# Patient Record
Sex: Female | Born: 1940 | Hispanic: No | Marital: Married
Health system: Southern US, Community
[De-identification: ages and names within clinical notes are randomized; demographics above are authoritative.]

---

## 2003-07-15 ENCOUNTER — Other Ambulatory Visit: Payer: Self-pay

## 2003-11-30 ENCOUNTER — Other Ambulatory Visit: Payer: Self-pay

## 2003-11-30 ENCOUNTER — Ambulatory Visit: Payer: Self-pay | Admitting: Surgery

## 2003-12-02 ENCOUNTER — Ambulatory Visit: Payer: Self-pay | Admitting: Surgery

## 2004-09-27 ENCOUNTER — Other Ambulatory Visit: Payer: Self-pay

## 2004-09-27 ENCOUNTER — Ambulatory Visit: Payer: Self-pay | Admitting: Surgery

## 2004-09-30 ENCOUNTER — Ambulatory Visit: Payer: Self-pay | Admitting: Surgery

## 2004-10-31 ENCOUNTER — Ambulatory Visit: Payer: Self-pay | Admitting: Surgery

## 2004-11-21 ENCOUNTER — Ambulatory Visit: Payer: Self-pay | Admitting: Surgery

## 2004-11-22 ENCOUNTER — Ambulatory Visit: Payer: Self-pay | Admitting: Surgery

## 2004-12-06 ENCOUNTER — Ambulatory Visit: Payer: Self-pay | Admitting: Surgery

## 2005-02-16 ENCOUNTER — Inpatient Hospital Stay: Payer: Self-pay | Admitting: Internal Medicine

## 2005-03-08 ENCOUNTER — Other Ambulatory Visit: Payer: Self-pay

## 2005-03-15 ENCOUNTER — Inpatient Hospital Stay: Payer: Self-pay | Admitting: Surgery

## 2005-03-21 ENCOUNTER — Inpatient Hospital Stay (HOSPITAL_COMMUNITY)
Admission: RE | Admit: 2005-03-21 | Discharge: 2005-03-28 | Payer: Self-pay | Admitting: Physical Medicine & Rehabilitation

## 2005-03-21 ENCOUNTER — Ambulatory Visit: Payer: Self-pay | Admitting: Physical Medicine & Rehabilitation

## 2005-12-01 ENCOUNTER — Ambulatory Visit: Payer: Self-pay | Admitting: Gastroenterology

## 2006-01-26 ENCOUNTER — Ambulatory Visit: Payer: Self-pay | Admitting: Internal Medicine

## 2006-02-20 ENCOUNTER — Ambulatory Visit: Payer: Self-pay | Admitting: Gastroenterology

## 2006-02-26 ENCOUNTER — Ambulatory Visit: Payer: Self-pay | Admitting: Internal Medicine

## 2008-06-15 ENCOUNTER — Ambulatory Visit: Payer: Self-pay | Admitting: Internal Medicine

## 2008-08-07 ENCOUNTER — Ambulatory Visit: Payer: Self-pay | Admitting: Family Medicine

## 2010-02-24 ENCOUNTER — Ambulatory Visit: Payer: Self-pay | Admitting: Internal Medicine

## 2010-07-11 ENCOUNTER — Ambulatory Visit: Payer: Self-pay | Admitting: Neurology

## 2010-08-31 ENCOUNTER — Encounter: Payer: Self-pay | Admitting: Nurse Practitioner

## 2010-08-31 ENCOUNTER — Encounter: Payer: Self-pay | Admitting: Cardiothoracic Surgery

## 2010-09-03 ENCOUNTER — Encounter: Payer: Self-pay | Admitting: Nurse Practitioner

## 2010-09-03 ENCOUNTER — Encounter: Payer: Self-pay | Admitting: Cardiothoracic Surgery

## 2010-10-03 ENCOUNTER — Encounter: Payer: Self-pay | Admitting: Nurse Practitioner

## 2010-10-20 ENCOUNTER — Encounter: Payer: Self-pay | Admitting: Cardiothoracic Surgery

## 2010-11-03 ENCOUNTER — Inpatient Hospital Stay: Payer: Self-pay | Admitting: Internal Medicine

## 2010-11-03 ENCOUNTER — Encounter: Payer: Self-pay | Admitting: Nurse Practitioner

## 2010-11-10 ENCOUNTER — Encounter: Payer: Self-pay | Admitting: Cardiothoracic Surgery

## 2010-11-24 ENCOUNTER — Emergency Department: Payer: Self-pay | Admitting: Unknown Physician Specialty

## 2010-12-03 ENCOUNTER — Encounter: Payer: Self-pay | Admitting: Nurse Practitioner

## 2010-12-03 ENCOUNTER — Encounter: Payer: Self-pay | Admitting: Cardiothoracic Surgery

## 2011-01-03 ENCOUNTER — Encounter: Payer: Self-pay | Admitting: Nurse Practitioner

## 2011-01-03 ENCOUNTER — Encounter: Payer: Self-pay | Admitting: Cardiothoracic Surgery

## 2013-01-03 IMAGING — CR DG CHEST 2V
1 series · 3 of 3 positions shown · non-contrast
Comparison: none

REASON FOR EXAM: elevated wbc sob
COMMENTS:

[Series 1: x chest ap · 0.14mm/px · 3 of 3 slices shown]
[im 1/3]
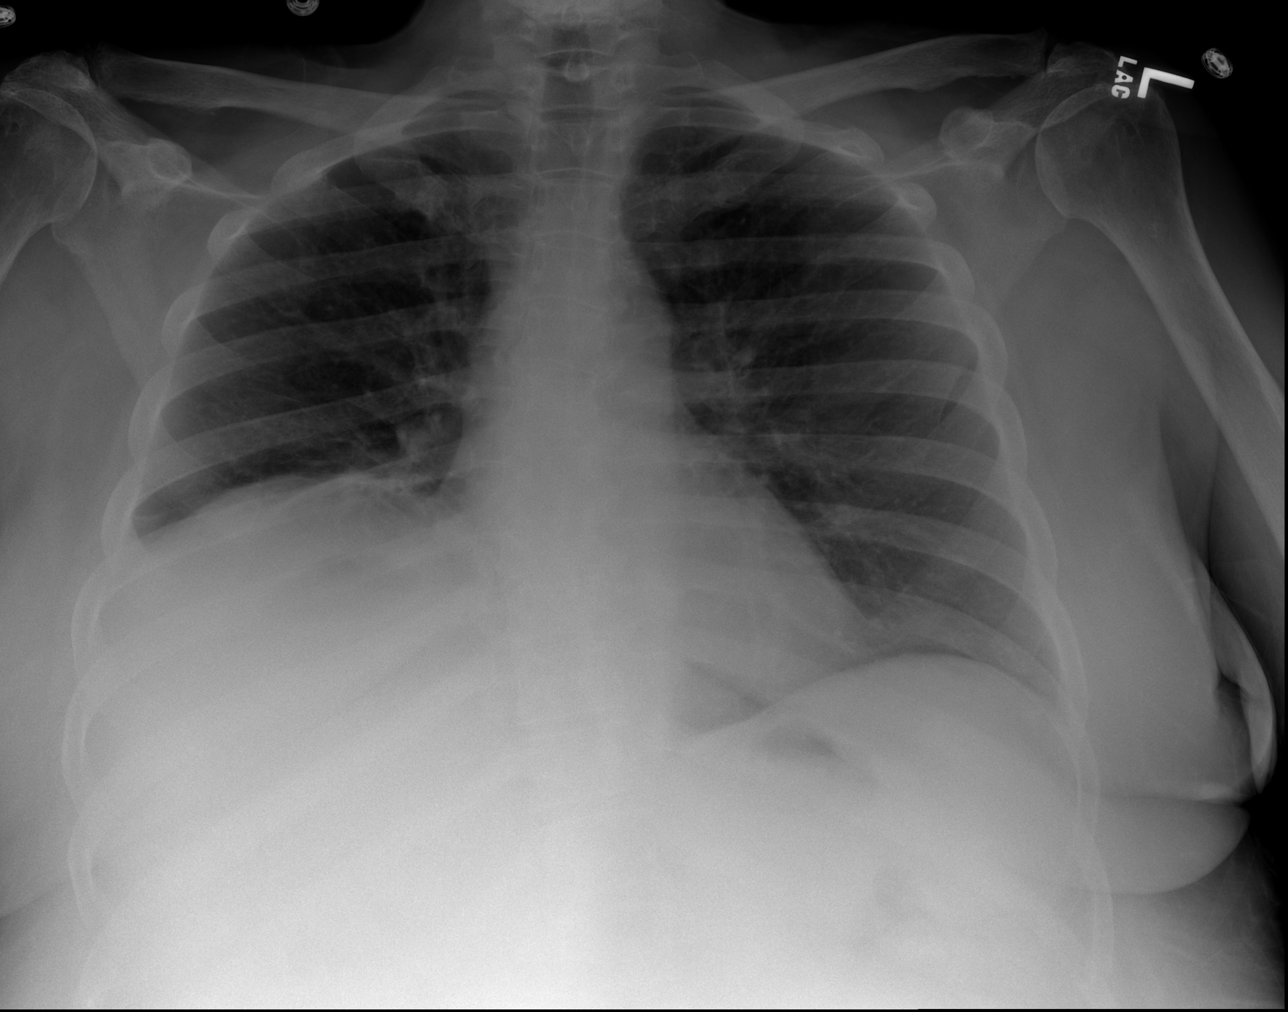
[im 2/3]
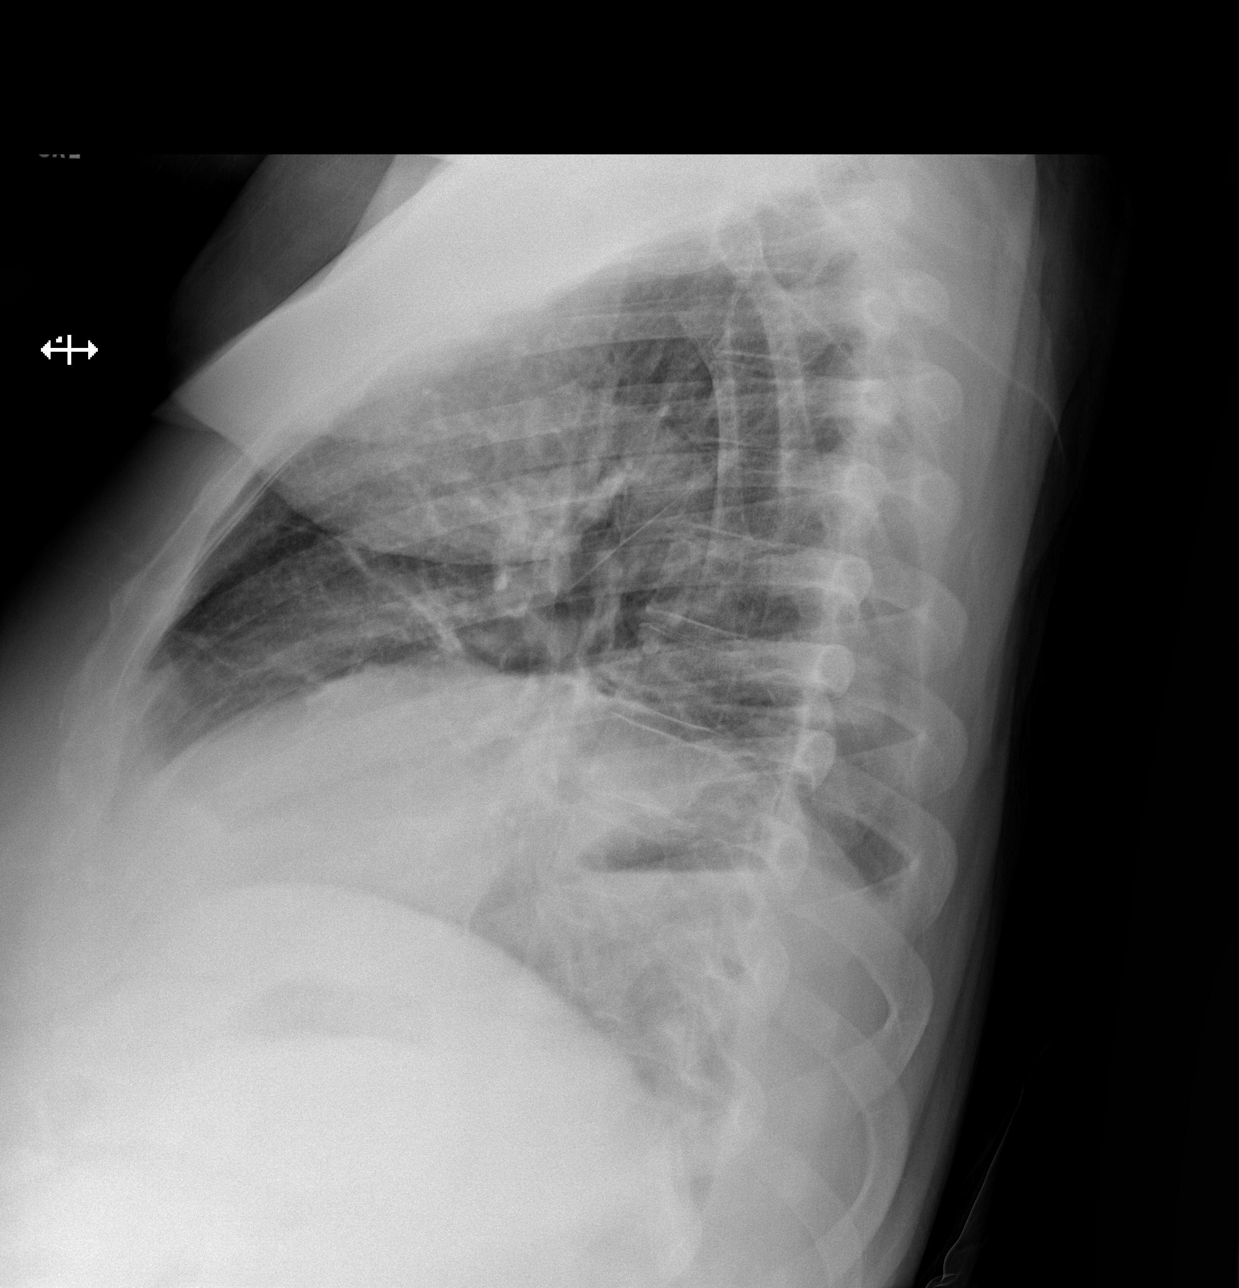
[im 3/3]
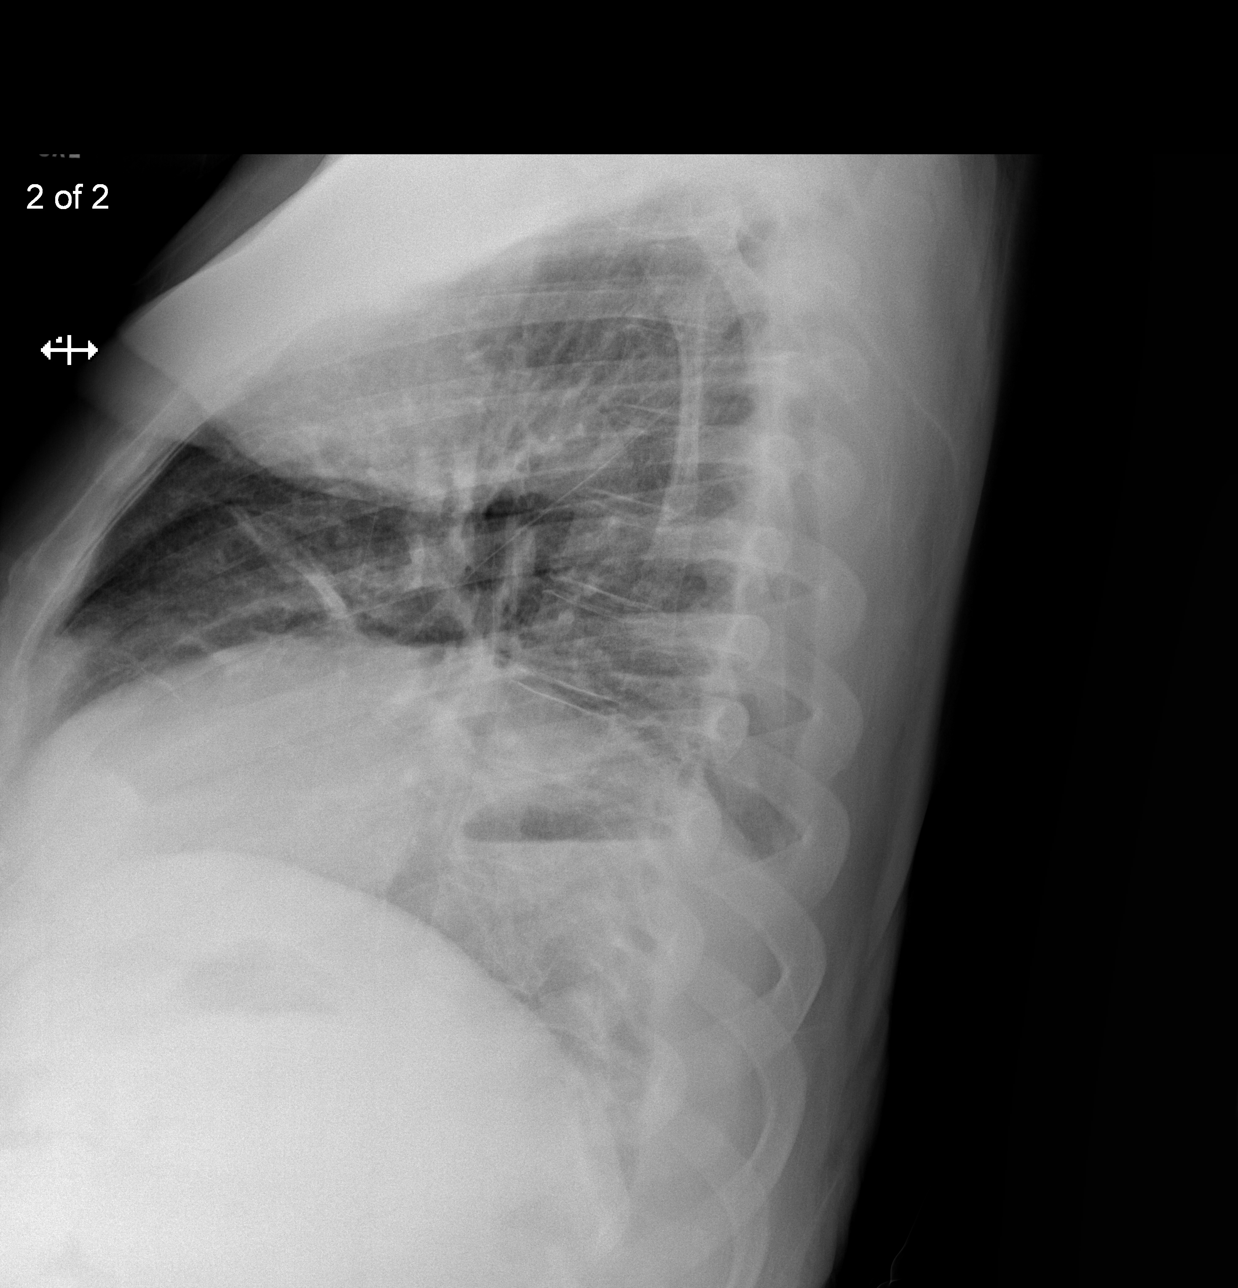

[3 of 3 positions shown; findings below may reference images not displayed]

PROCEDURE:     DXR - DXR CHEST PA (OR AP) AND LATERAL  - November 04, 2010  [DATE]

RESULT:     There is elevation of the right hemidiaphragm. There is no
evidence of focal infiltrates, effusions or edema. The cardiac silhouette is
normal limits. Vascular crowding is identified within the right lung base.
IMPRESSION: No evidence of acute cardiopulmonary disease.

## 2014-06-29 ENCOUNTER — Other Ambulatory Visit: Payer: Self-pay | Admitting: *Deleted

## 2019-05-02 ENCOUNTER — Telehealth: Payer: Self-pay | Admitting: *Deleted

## 2019-05-02 NOTE — Telephone Encounter (Signed)
This RN spoke with pt today - she states she feels diarrhea is improving- she is able to urinate without having a stool. Stool is beginning to take more form.  She feels overall improvement and will contact this RN on Monday with further update.  No further needs at this time.
